# Patient Record
Sex: Male | Born: 1985
Health system: Southern US, Community
[De-identification: ages and names within clinical notes are randomized; demographics above are authoritative.]

## PROBLEM LIST (undated history)

## (undated) DIAGNOSIS — F988 Other specified behavioral and emotional disorders with onset usually occurring in childhood and adolescence: Secondary | ICD-10-CM

---
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
## 2018-07-25 DIAGNOSIS — F900 Attention-deficit hyperactivity disorder, predominantly inattentive type: Secondary | ICD-10-CM | POA: Diagnosis not present

## 2018-07-25 DIAGNOSIS — F3189 Other bipolar disorder: Secondary | ICD-10-CM | POA: Diagnosis not present

## 2018-12-23 DIAGNOSIS — J018 Other acute sinusitis: Secondary | ICD-10-CM | POA: Diagnosis not present

## 2019-02-11 DIAGNOSIS — H52223 Regular astigmatism, bilateral: Secondary | ICD-10-CM | POA: Diagnosis not present

## 2019-02-11 DIAGNOSIS — H5213 Myopia, bilateral: Secondary | ICD-10-CM | POA: Diagnosis not present

## 2019-03-10 ENCOUNTER — Ambulatory Visit: Payer: BC Managed Care – PPO | Attending: Internal Medicine

## 2019-03-10 DIAGNOSIS — Z20822 Contact with and (suspected) exposure to covid-19: Secondary | ICD-10-CM

## 2019-03-10 DIAGNOSIS — Z20828 Contact with and (suspected) exposure to other viral communicable diseases: Secondary | ICD-10-CM | POA: Insufficient documentation

## 2019-03-12 LAB — NOVEL CORONAVIRUS, NAA: SARS-CoV-2, NAA: NOT DETECTED

## 2019-03-13 ENCOUNTER — Telehealth: Payer: Self-pay | Admitting: *Deleted

## 2019-03-13 NOTE — Telephone Encounter (Signed)
Patient called given negative covid results . 

## 2019-03-19 ENCOUNTER — Ambulatory Visit: Payer: BC Managed Care – PPO | Attending: Internal Medicine

## 2019-03-19 DIAGNOSIS — Z20822 Contact with and (suspected) exposure to covid-19: Secondary | ICD-10-CM

## 2019-03-19 DIAGNOSIS — U071 COVID-19: Secondary | ICD-10-CM | POA: Insufficient documentation

## 2019-03-20 LAB — NOVEL CORONAVIRUS, NAA: SARS-CoV-2, NAA: DETECTED — AB

## 2019-03-22 DIAGNOSIS — F3189 Other bipolar disorder: Secondary | ICD-10-CM | POA: Diagnosis not present

## 2019-03-22 DIAGNOSIS — F900 Attention-deficit hyperactivity disorder, predominantly inattentive type: Secondary | ICD-10-CM | POA: Diagnosis not present

## 2019-04-22 DIAGNOSIS — Z79899 Other long term (current) drug therapy: Secondary | ICD-10-CM | POA: Diagnosis not present

## 2019-04-22 DIAGNOSIS — Z130 Encounter for screening for diseases of the blood and blood-forming organs and certain disorders involving the immune mechanism: Secondary | ICD-10-CM | POA: Diagnosis not present

## 2019-06-24 DIAGNOSIS — L814 Other melanin hyperpigmentation: Secondary | ICD-10-CM | POA: Diagnosis not present

## 2019-06-24 DIAGNOSIS — R52 Pain, unspecified: Secondary | ICD-10-CM | POA: Diagnosis not present

## 2019-06-24 DIAGNOSIS — D225 Melanocytic nevi of trunk: Secondary | ICD-10-CM | POA: Diagnosis not present

## 2019-06-24 DIAGNOSIS — D224 Melanocytic nevi of scalp and neck: Secondary | ICD-10-CM | POA: Diagnosis not present

## 2019-06-24 DIAGNOSIS — D2261 Melanocytic nevi of right upper limb, including shoulder: Secondary | ICD-10-CM | POA: Diagnosis not present

## 2019-09-23 ENCOUNTER — Other Ambulatory Visit: Payer: Self-pay

## 2019-09-23 ENCOUNTER — Ambulatory Visit (HOSPITAL_COMMUNITY)
Admission: EM | Admit: 2019-09-23 | Discharge: 2019-09-23 | Disposition: A | Discharge status: Home / Self Care | Payer: BC Managed Care – PPO | Attending: Family Medicine | Admitting: Family Medicine

## 2019-09-23 ENCOUNTER — Encounter (HOSPITAL_COMMUNITY): Payer: Self-pay

## 2019-09-23 DIAGNOSIS — R509 Fever, unspecified: Secondary | ICD-10-CM | POA: Diagnosis not present

## 2019-09-23 DIAGNOSIS — R109 Unspecified abdominal pain: Secondary | ICD-10-CM | POA: Insufficient documentation

## 2019-09-23 DIAGNOSIS — Z79899 Other long term (current) drug therapy: Secondary | ICD-10-CM | POA: Diagnosis not present

## 2019-09-23 DIAGNOSIS — R197 Diarrhea, unspecified: Secondary | ICD-10-CM | POA: Diagnosis not present

## 2019-09-23 DIAGNOSIS — Z87891 Personal history of nicotine dependence: Secondary | ICD-10-CM | POA: Insufficient documentation

## 2019-09-23 DIAGNOSIS — Z20822 Contact with and (suspected) exposure to covid-19: Secondary | ICD-10-CM | POA: Insufficient documentation

## 2019-09-23 HISTORY — DX: Other specified behavioral and emotional disorders with onset usually occurring in childhood and adolescence: F98.8

## 2019-09-23 LAB — SARS CORONAVIRUS 2 (TAT 6-24 HRS): SARS Coronavirus 2: NEGATIVE

## 2019-09-23 MED ORDER — DICYCLOMINE HCL 20 MG PO TABS
20.0000 mg | ORAL_TABLET | Freq: Two times a day (BID) | ORAL | 0 refills | Status: DC
Start: 2019-09-23 — End: 2020-10-18

## 2019-09-23 NOTE — ED Triage Notes (Signed)
Pt c/o acute abdominal pain, fever with Tmax 104, diarrhea, nausea since Friday. Pt states he ate medium rare steak at a restaurant for lunch on Friday and a medium cooked (pink) burger at a restaurant on Thursday night.  Pt states fever responded to tylenol/ibuprofen.  Diarrhea and abdominal cramping continues this morning but has improved since symptom onset.  Last dose of ibuprofen/tylenol yesterday; afebrile today. Tolerating po fluids, smoothies and bland diet. Denies any URI symptoms. Denies anyone else having symptoms but no one ate the same meat products as pt.  Positive for COVID at start of this year.

## 2019-09-23 NOTE — Discharge Instructions (Signed)
Believe this is something that we will just have to run its course.  Seems as if your symptoms are improving.  Keep doing a bland diet, staying hydrated We have given you some information on diarrhea and food choices. If the diarrhea persist past 10 days you may want to come back for recheck and have stool sample tested. Follow up as needed for continued or worsening symptoms

## 2019-09-23 NOTE — ED Provider Notes (Addendum)
[facility]    CSN: [id number] Arrival date & time: 09/23/19  0818      History   Chief Complaint Chief Complaint  Patient presents with  . Fever  . Abdominal Pain    HPI Jared Cohen is a 34 y.o. male.   Pt is an overall healthy 34 year old male presenting with complaints of cramping abdominal pain, diarrhea, and fever beginning Friday. Reports consuming rare-cooked red meat several times in the last week or so. Fever alleviated by Ibuprofen and Tylenol. Cramping pain and diarrhea initially worsened over the first few days but have since improved. Patient is still having intermittent episodes of cramping and diarrhea. Able to tolerate PO intake of soft foods and liquids. Reports noting blood on toilet paper when wiping. Denies dark red stools, vomiting, nausea.  ROS per HPI      Past Medical History:  Diagnosis Date  . ADD (attention deficit disorder)     There are no problems to display for this patient.   History reviewed. No pertinent surgical history.     Home Medications    Prior to Admission medications   Medication Sig Start Date End Date Taking? Authorizing Provider  amphetamine-dextroamphetamine (ADDERALL) 30 MG tablet Take 30 mg by mouth daily.   Yes [provider]  divalproex (DEPAKOTE ER) 500 MG 24 hr tablet TAKE 3 TABLETS BY MOUTH EACH P.M. 09/07/16  Yes [provider]  dicyclomine (BENTYL) 20 MG tablet Take 1 tablet (20 mg total) by mouth 2 (two) times daily. 09/23/19   Dahlia Byes A, NP  zaleplon (SONATA) 10 MG capsule TAKE 2 CAPSULES BY MOUTH EVERY DAY AT BEDTIME 10/25/18   [provider]    Family History Family History  Problem Relation Age of Onset  . Healthy Mother   . Diabetes Father     Social History Social History   Tobacco Use  . Smoking status: Former Games developer  . Smokeless tobacco: Never Used  Vaping Use  . Vaping Use: Former  Substance Use Topics  . Alcohol use: Yes  . Drug use:  Yes    Types: Marijuana     Allergies   Patient has no known allergies.   Review of Systems Review of Systems  Constitutional: Positive for fatigue and fever.  HENT: Negative.   Respiratory: Negative.   Cardiovascular: Negative.   Gastrointestinal: Positive for abdominal pain and diarrhea. Negative for blood in stool, nausea and vomiting.  Endocrine: Negative.   Genitourinary: Negative.   Musculoskeletal: Negative.   Skin: Negative.   Allergic/Immunologic: Negative.   Neurological: Negative.      Physical Exam Triage Vital Signs ED Triage Vitals  Enc Vitals Group     BP 09/23/19 0849 (!) 135/92     Pulse Rate 09/23/19 0849 73     Resp 09/23/19 0849 18     Temp 09/23/19 0849 98.3 F (36.8 C)     Temp Source 09/23/19 0849 Oral     SpO2 09/23/19 0849 100 %     Weight --      Height --      Head Circumference --      Peak Flow --      Pain Score 09/23/19 0848 2     Pain Loc --      Pain Edu? --      Excl. in GC? --    No data found.  Updated Vital Signs BP (!) 135/92 (BP Location: Right Arm)   Pulse 73  Temp 98.3 F (36.8 C) (Oral)   Resp 18   SpO2 100%   Visual Acuity Right Eye Distance:   Left Eye Distance:   Bilateral Distance:    Right Eye Near:   Left Eye Near:    Bilateral Near:     Physical Exam Constitutional:      General: He is not in acute distress.    Appearance: He is normal weight.  HENT:     Head: Normocephalic.  Abdominal:     General: Abdomen is flat. Bowel sounds are normal. There is no distension.     Palpations: Abdomen is soft. There is no mass.     Tenderness: There is no abdominal tenderness.     Hernia: No hernia is present.  Skin:    General: Skin is warm and dry.     Capillary Refill: Capillary refill takes less than 2 seconds.  Neurological:     General: No focal deficit present.     Mental Status: He is alert.  Psychiatric:        Mood and Affect: Mood normal.        Behavior: Behavior normal.      UC  Treatments / Results  Labs (all labs ordered are listed, but only abnormal results are displayed) Labs Reviewed  SARS CORONAVIRUS 2 (TAT 6-24 HRS)    EKG   Radiology No results found.  Procedures Procedures (including critical care time)  Medications Ordered in UC Medications - No data to display  Initial Impression / Assessment and Plan / UC Course  I have reviewed the triage vital signs and the nursing notes.  Pertinent labs & imaging results that were available during my care of the patient were reviewed by me and considered in my medical decision making (see chart for details).     Diarrhea most likely presumed infectious.  Symptoms have eased up since the beginning. Bentyl for continued stomach cramping VSS and he is non toxic or ill appearing today.  Recommended drink plenty fluids include Gatorade, water and Pedialyte Follow up as needed for continued or worsening symptoms  Final Clinical Impressions(s) / UC Diagnoses   Final diagnoses:  Diarrhea of presumed infectious origin     Discharge Instructions     Believe this is something that we will just have to run its course.  Seems as if your symptoms are improving.  Keep doing a bland diet, staying hydrated We have given you some information on diarrhea and food choices. If the diarrhea persist past 10 days you may want to come back for recheck and have stool sample tested. Follow up as needed for continued or worsening symptoms     ED Prescriptions    Medication Sig Dispense Auth. Provider   dicyclomine (BENTYL) 20 MG tablet Take 1 tablet (20 mg total) by mouth 2 (two) times daily. 20 tablet Dahlia Byes A, NP     PDMP not reviewed this encounter.   Janace Aris, NP 09/23/19 1250    Dahlia Byes A, NP 09/23/19 1331

## 2019-10-29 ENCOUNTER — Ambulatory Visit (HOSPITAL_COMMUNITY)
Admission: EM | Admit: 2019-10-29 | Discharge: 2019-10-29 | Disposition: A | Discharge status: Home / Self Care | Payer: BC Managed Care – PPO | Attending: Family Medicine | Admitting: Family Medicine

## 2019-10-29 ENCOUNTER — Other Ambulatory Visit: Payer: Self-pay

## 2019-10-29 DIAGNOSIS — Z1152 Encounter for screening for COVID-19: Secondary | ICD-10-CM | POA: Diagnosis not present

## 2019-10-29 DIAGNOSIS — Z20822 Contact with and (suspected) exposure to covid-19: Secondary | ICD-10-CM | POA: Insufficient documentation

## 2019-10-29 LAB — SARS CORONAVIRUS 2 (TAT 6-24 HRS): SARS Coronavirus 2: NEGATIVE

## 2019-10-29 NOTE — ED Triage Notes (Signed)
Pt presents for COVID test. Denies any symptoms related to to COVID.

## 2019-12-03 DIAGNOSIS — Z20822 Contact with and (suspected) exposure to covid-19: Secondary | ICD-10-CM | POA: Diagnosis not present

## 2020-01-31 DIAGNOSIS — F900 Attention-deficit hyperactivity disorder, predominantly inattentive type: Secondary | ICD-10-CM | POA: Diagnosis not present

## 2020-01-31 DIAGNOSIS — F6381 Intermittent explosive disorder: Secondary | ICD-10-CM | POA: Diagnosis not present

## 2020-02-28 DIAGNOSIS — H10021 Other mucopurulent conjunctivitis, right eye: Secondary | ICD-10-CM | POA: Diagnosis not present

## 2020-05-20 ENCOUNTER — Emergency Department (HOSPITAL_COMMUNITY)
Admission: EM | Admit: 2020-05-20 | Discharge: 2020-05-20 | Disposition: A | Discharge status: Home / Self Care | Payer: BC Managed Care – PPO | Attending: Emergency Medicine | Admitting: Emergency Medicine

## 2020-05-20 ENCOUNTER — Emergency Department (HOSPITAL_COMMUNITY): Payer: BC Managed Care – PPO

## 2020-05-20 ENCOUNTER — Other Ambulatory Visit: Payer: Self-pay

## 2020-05-20 DIAGNOSIS — S0101XA Laceration without foreign body of scalp, initial encounter: Secondary | ICD-10-CM

## 2020-05-20 DIAGNOSIS — S0990XA Unspecified injury of head, initial encounter: Secondary | ICD-10-CM

## 2020-05-20 DIAGNOSIS — R58 Hemorrhage, not elsewhere classified: Secondary | ICD-10-CM | POA: Diagnosis not present

## 2020-05-20 DIAGNOSIS — Y92410 Unspecified street and highway as the place of occurrence of the external cause: Secondary | ICD-10-CM | POA: Diagnosis not present

## 2020-05-20 DIAGNOSIS — R410 Disorientation, unspecified: Secondary | ICD-10-CM | POA: Diagnosis not present

## 2020-05-20 DIAGNOSIS — I10 Essential (primary) hypertension: Secondary | ICD-10-CM | POA: Diagnosis not present

## 2020-05-20 DIAGNOSIS — Z23 Encounter for immunization: Secondary | ICD-10-CM | POA: Insufficient documentation

## 2020-05-20 DIAGNOSIS — Z041 Encounter for examination and observation following transport accident: Secondary | ICD-10-CM | POA: Diagnosis not present

## 2020-05-20 DIAGNOSIS — M79669 Pain in unspecified lower leg: Secondary | ICD-10-CM | POA: Diagnosis not present

## 2020-05-20 LAB — CBC WITH DIFFERENTIAL/PLATELET
Abs Immature Granulocytes: 0.03 10*3/uL (ref 0.00–0.07)
Basophils Absolute: 0 10*3/uL (ref 0.0–0.1)
Basophils Relative: 0 %
Eosinophils Absolute: 0.6 10*3/uL — ABNORMAL HIGH (ref 0.0–0.5)
Eosinophils Relative: 6 %
HCT: 46 % (ref 39.0–52.0)
Hemoglobin: 15.1 g/dL (ref 13.0–17.0)
Immature Granulocytes: 0 %
Lymphocytes Relative: 25 %
Lymphs Abs: 2.5 10*3/uL (ref 0.7–4.0)
MCH: 31.5 pg (ref 26.0–34.0)
MCHC: 32.8 g/dL (ref 30.0–36.0)
MCV: 95.8 fL (ref 80.0–100.0)
Monocytes Absolute: 0.7 10*3/uL (ref 0.1–1.0)
Monocytes Relative: 7 %
Neutro Abs: 6.2 10*3/uL (ref 1.7–7.7)
Neutrophils Relative %: 62 %
Platelets: 233 10*3/uL (ref 150–400)
RBC: 4.8 MIL/uL (ref 4.22–5.81)
RDW: 12.7 % (ref 11.5–15.5)
WBC: 10.1 10*3/uL (ref 4.0–10.5)
nRBC: 0 % (ref 0.0–0.2)

## 2020-05-20 LAB — PROTIME-INR
INR: 1.1 (ref 0.8–1.2)
Prothrombin Time: 13.7 seconds (ref 11.4–15.2)

## 2020-05-20 LAB — BASIC METABOLIC PANEL
Anion gap: 10 (ref 5–15)
BUN: 18 mg/dL (ref 6–20)
CO2: 25 mmol/L (ref 22–32)
Calcium: 9.2 mg/dL (ref 8.9–10.3)
Chloride: 101 mmol/L (ref 98–111)
Creatinine, Ser: 1.02 mg/dL (ref 0.61–1.24)
GFR, Estimated: >60 mL/min (ref >60)
Glucose, Bld: 119 mg/dL — ABNORMAL HIGH (ref 70–99)
Potassium: 3.8 mmol/L (ref 3.5–5.1)
Sodium: 136 mmol/L (ref 135–145)

## 2020-05-20 MED ORDER — ONDANSETRON HCL 4 MG PO TABS
4.0000 mg | ORAL_TABLET | Freq: Once | ORAL | Status: AC
  Administered 2020-05-20: 4 mg via ORAL
  Filled 2020-05-20: qty 1

## 2020-05-20 MED ORDER — MORPHINE SULFATE (PF) 4 MG/ML IV SOLN
4.0000 mg | Freq: Once | INTRAVENOUS | Status: AC
  Administered 2020-05-20: 4 mg via INTRAVENOUS

## 2020-05-20 MED ORDER — HYDROCODONE-ACETAMINOPHEN 7.5-325 MG PO TABS
1.0000 | ORAL_TABLET | Freq: Four times a day (QID) | ORAL | 0 refills | Status: AC | PRN
Start: 2020-05-20 — End: 2020-05-25

## 2020-05-20 MED ORDER — TETANUS-DIPHTH-ACELL PERTUSSIS 5-2.5-18.5 LF-MCG/0.5 IM SUSY
0.5000 mL | PREFILLED_SYRINGE | Freq: Once | INTRAMUSCULAR | Status: AC
  Administered 2020-05-20: 0.5 mL via INTRAMUSCULAR

## 2020-05-20 MED ORDER — MORPHINE SULFATE (PF) 4 MG/ML IV SOLN
INTRAVENOUS | Status: AC
  Administered 2020-05-20: 4 mg via INTRAVENOUS
  Filled 2020-05-20: qty 1

## 2020-05-20 MED ORDER — HYDROCODONE-ACETAMINOPHEN 7.5-325 MG PO TABS
1.0000 | ORAL_TABLET | Freq: Once | ORAL | Status: AC
  Administered 2020-05-20: 1 via ORAL
  Filled 2020-05-20: qty 1

## 2020-05-20 MED ORDER — ONDANSETRON HCL 4 MG PO TABS: 4.0000 mg | ORAL_TABLET | Freq: Four times a day (QID) | ORAL | 0 refills | Status: DC

## 2020-05-20 MED ORDER — SODIUM CHLORIDE 0.9 % IV BOLUS
1000.0000 mL | Freq: Once | INTRAVENOUS | Status: AC
  Administered 2020-05-20: 1000 mL via INTRAVENOUS

## 2020-05-20 MED ORDER — LIDOCAINE-EPINEPHRINE 1 %-1:100000 IJ SOLN
20.0000 mL | Freq: Once | INTRAMUSCULAR | Status: AC
  Administered 2020-05-20: 20 mL via INTRADERMAL

## 2020-05-20 MED ORDER — MORPHINE SULFATE (PF) 4 MG/ML IV SOLN
4.0000 mg | Freq: Once | INTRAVENOUS | Status: AC
  Filled 2020-05-20: qty 1

## 2020-05-20 NOTE — ED Notes (Signed)
Dr. Wilkie Aye at bedside for wound stabling
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
## 2020-05-20 NOTE — Discharge Instructions (Addendum)
You have been seen and discharged from the emergency department.  You sustained a large significant laceration to the scalp.  This has been repaired with staples and one blue suture.  Keep this laceration dry for the next 24 hours.  After that you may let warm water run over the laceration.  Pat to dry and redress the area for the next 24 hours.  After 48 hours you may let the laceration air dry and remain unwrapped.  Take nausea medicine and pain medicine as prescribed.  Follow-up with your primary provider for reevaluation, the staples and sutures can be removed in 7 to 10 days. Take home medications as prescribed. If you have any worsening symptoms, severe headache, neuro symptoms or further concerns for health please return to an emergency department for further evaluation.

## 2020-05-20 NOTE — ED Notes (Signed)
Pt returned from CT °

## 2020-05-20 NOTE — ED Notes (Signed)
Delayed to CT d/t attempts to control head bleeding with sutures and staples
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
## 2020-05-20 NOTE — ED Notes (Signed)
Dr Wilkie Aye at bedside. Staple head

## 2020-05-31 DIAGNOSIS — S0101XD Laceration without foreign body of scalp, subsequent encounter: Secondary | ICD-10-CM | POA: Diagnosis not present

## 2020-05-31 DIAGNOSIS — S060X1A Concussion with loss of consciousness of 30 minutes or less, initial encounter: Secondary | ICD-10-CM | POA: Diagnosis not present

## 2020-07-26 DIAGNOSIS — L255 Unspecified contact dermatitis due to plants, except food: Secondary | ICD-10-CM | POA: Diagnosis not present

## 2020-08-03 ENCOUNTER — Ambulatory Visit: Payer: BC Managed Care – PPO | Admitting: Neurology

## 2020-08-03 ENCOUNTER — Telehealth: Payer: Self-pay | Admitting: *Deleted

## 2020-08-03 ENCOUNTER — Encounter: Payer: Self-pay | Admitting: Neurology

## 2020-08-03 NOTE — Telephone Encounter (Signed)
Patient was no show for new patient appointment today. 

## 2020-09-28 DIAGNOSIS — J0140 Acute pansinusitis, unspecified: Secondary | ICD-10-CM | POA: Diagnosis not present

## 2020-10-18 ENCOUNTER — Ambulatory Visit (INDEPENDENT_AMBULATORY_CARE_PROVIDER_SITE_OTHER): Payer: BC Managed Care – PPO | Admitting: Neurology

## 2020-10-18 ENCOUNTER — Encounter: Payer: Self-pay | Admitting: Neurology

## 2020-10-18 ENCOUNTER — Other Ambulatory Visit: Payer: Self-pay

## 2020-10-18 VITALS — BP 131/89 | HR 76 | Ht 68.0 in | Wt 160.0 lb

## 2020-10-18 DIAGNOSIS — M47812 Spondylosis without myelopathy or radiculopathy, cervical region: Secondary | ICD-10-CM

## 2020-10-18 DIAGNOSIS — F0781 Postconcussional syndrome: Secondary | ICD-10-CM
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
## 2020-11-18 DIAGNOSIS — Z0000 Encounter for general adult medical examination without abnormal findings: Secondary | ICD-10-CM | POA: Diagnosis not present

## 2021-01-31 ENCOUNTER — Telehealth: Payer: Self-pay | Admitting: *Deleted

## 2021-01-31 NOTE — Telephone Encounter (Signed)
error 

## 2021-02-08 DIAGNOSIS — H5213 Myopia, bilateral: Secondary | ICD-10-CM | POA: Diagnosis not present

## 2021-02-08 DIAGNOSIS — H52223 Regular astigmatism, bilateral: Secondary | ICD-10-CM | POA: Diagnosis not present

## 2021-02-25 DIAGNOSIS — F900 Attention-deficit hyperactivity disorder, predominantly inattentive type: Secondary | ICD-10-CM | POA: Diagnosis not present
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
## 2021-07-24 DIAGNOSIS — L255 Unspecified contact dermatitis due to plants, except food: Secondary | ICD-10-CM | POA: Diagnosis not present

## 2021-09-09 DIAGNOSIS — F900 Attention-deficit hyperactivity disorder, predominantly inattentive type: Secondary | ICD-10-CM | POA: Diagnosis not present

## 2021-11-11 DIAGNOSIS — J329 Chronic sinusitis, unspecified: Secondary | ICD-10-CM | POA: Diagnosis not present

## 2021-11-11 DIAGNOSIS — H66003 Acute suppurative otitis media without spontaneous rupture of ear drum, bilateral: Secondary | ICD-10-CM | POA: Diagnosis not present

## 2021-11-25 DIAGNOSIS — Z13220 Encounter for screening for lipoid disorders: Secondary | ICD-10-CM | POA: Diagnosis not present

## 2021-11-25 DIAGNOSIS — Z0000 Encounter for general adult medical examination without abnormal findings: Secondary | ICD-10-CM | POA: Diagnosis not present

## 2022-03-10 DIAGNOSIS — B349 Viral infection, unspecified: Secondary | ICD-10-CM | POA: Diagnosis not present

## 2022-06-02 DIAGNOSIS — F909 Attention-deficit hyperactivity disorder, unspecified type: Secondary | ICD-10-CM | POA: Diagnosis not present

## 2022-09-25 DIAGNOSIS — L239 Allergic contact dermatitis, unspecified cause: Secondary | ICD-10-CM | POA: Diagnosis not present

## 2022-12-01 DIAGNOSIS — E291 Testicular hypofunction: Secondary | ICD-10-CM | POA: Diagnosis not present

## 2022-12-01 DIAGNOSIS — R7989 Other specified abnormal findings of blood chemistry: Secondary | ICD-10-CM | POA: Diagnosis not present

## 2022-12-01 DIAGNOSIS — M25562 Pain in left knee: Secondary | ICD-10-CM | POA: Diagnosis not present

## 2022-12-01 DIAGNOSIS — Z13220 Encounter for screening for lipoid disorders: Secondary | ICD-10-CM | POA: Diagnosis not present

## 2022-12-01 DIAGNOSIS — F909 Attention-deficit hyperactivity disorder, unspecified type: Secondary | ICD-10-CM | POA: Diagnosis not present

## 2022-12-01 DIAGNOSIS — J302 Other seasonal allergic rhinitis: Secondary | ICD-10-CM | POA: Diagnosis not present

## 2022-12-01 DIAGNOSIS — Z0000 Encounter for general adult medical examination without abnormal findings: Secondary | ICD-10-CM | POA: Diagnosis not present

## 2023-09-12 DIAGNOSIS — H6092 Unspecified otitis externa, left ear: Secondary | ICD-10-CM | POA: Diagnosis not present

## 2023-09-12 DIAGNOSIS — H60312 Diffuse otitis externa, left ear: Secondary | ICD-10-CM | POA: Diagnosis not present

## 2023-12-07 DIAGNOSIS — Z0000 Encounter for general adult medical examination without abnormal findings: Secondary | ICD-10-CM | POA: Diagnosis not present

## 2023-12-07 DIAGNOSIS — F909 Attention-deficit hyperactivity disorder, unspecified type: Secondary | ICD-10-CM | POA: Diagnosis not present

## 2023-12-07 DIAGNOSIS — E291 Testicular hypofunction: Secondary | ICD-10-CM | POA: Diagnosis not present

## 2023-12-07 DIAGNOSIS — Z1331 Encounter for screening for depression: Secondary | ICD-10-CM | POA: Diagnosis not present

## 2023-12-07 DIAGNOSIS — G479 Sleep disorder, unspecified: Secondary | ICD-10-CM | POA: Diagnosis not present

## 2024-01-21 DIAGNOSIS — T7840XA Allergy, unspecified, initial encounter: Secondary | ICD-10-CM | POA: Diagnosis not present

## 2024-01-21 DIAGNOSIS — T63441A Toxic effect of venom of bees, accidental (unintentional), initial encounter: Secondary | ICD-10-CM | POA: Diagnosis not present
